# Patient Record
Sex: Female | Born: 1990 | Race: White | Hispanic: No | Marital: Single | Smoking: Never smoker
Health system: Southern US, Community
[De-identification: ages and names within clinical notes are randomized; demographics above are authoritative.]

## PROBLEM LIST (undated history)

## (undated) HISTORY — PX: FRACTURE SURGERY: SHX138

---
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
## 2013-05-30 ENCOUNTER — Encounter (HOSPITAL_COMMUNITY): Payer: Self-pay | Admitting: Emergency Medicine

## 2013-05-30 ENCOUNTER — Emergency Department (HOSPITAL_COMMUNITY)
Admission: EM | Admit: 2013-05-30 | Discharge: 2013-05-30 | Disposition: A | Discharge status: Home / Self Care | Payer: 59 | Attending: Emergency Medicine | Admitting: Emergency Medicine

## 2013-05-30 DIAGNOSIS — K0889 Other specified disorders of teeth and supporting structures: Secondary | ICD-10-CM

## 2013-05-30 DIAGNOSIS — K089 Disorder of teeth and supporting structures, unspecified: Secondary | ICD-10-CM | POA: Insufficient documentation

## 2013-05-30 MED ORDER — OXYCODONE-ACETAMINOPHEN 5-325 MG PO TABS: 1.0000 | ORAL_TABLET | ORAL | Status: DC | PRN

## 2013-05-30 MED ORDER — CLINDAMYCIN HCL 300 MG PO CAPS: 300.0000 mg | ORAL_CAPSULE | Freq: Four times a day (QID) | ORAL | Status: DC

## 2013-05-30 MED ORDER — ONDANSETRON 8 MG PO TBDP
8.0000 mg | ORAL_TABLET | Freq: Once | ORAL | Status: AC
  Administered 2013-05-30: 8 mg via ORAL
  Filled 2013-05-30: qty 1

## 2013-05-30 MED ORDER — CLINDAMYCIN HCL 150 MG PO CAPS
300.0000 mg | ORAL_CAPSULE | Freq: Once | ORAL | Status: AC
  Administered 2013-05-30: 300 mg via ORAL
  Filled 2013-05-30: qty 2

## 2013-05-30 MED ORDER — OXYCODONE-ACETAMINOPHEN 5-325 MG PO TABS
2.0000 | ORAL_TABLET | Freq: Once | ORAL | Status: AC
  Administered 2013-05-30: 2 via ORAL
  Filled 2013-05-30: qty 2

## 2013-05-30 MED ORDER — ONDANSETRON 8 MG PO TBDP: ORAL_TABLET | ORAL | Status: DC

## 2013-05-30 NOTE — ED Provider Notes (Signed)
CSN: [id number]     Arrival date & time 05/30/13  1442 History   First MD Initiated Contact with Patient 05/30/13 1538     Chief Complaint  Patient presents with  . Dental Pain     (Consider location/radiation/quality/duration/timing/severity/associated sxs/prior Treatment) HPI.... pain lower central and lateral incisors bilaterally for 24 hours. [facility] approximately 11 PM. Given one dose of amoxicillin and Tylenol No. 3. She was unable to see her dentist today because of the weather. Pain is getting worse. Now patient complains of tenderness in the chin.  no fever or chills. Severity is mild to moderate.  History reviewed. No pertinent past medical history. Past Surgical History  Procedure Laterality Date  . Fracture surgery     History reviewed. No pertinent family history. History  Substance Use Topics  . Smoking status: Never Smoker   . Smokeless tobacco: Not on file  . Alcohol Use: No   OB History   Grav Para Term Preterm Abortions TAB SAB Ect Mult Living                 Review of Systems  All other systems reviewed and are negative.      Allergies  Review of patient's allergies indicates no known allergies.  Home Medications   Current Outpatient Rx  Name  Route  Sig  Dispense  Refill  . acetaminophen (TYLENOL) 500 MG tablet   Oral   Take 500 mg by mouth daily as needed for mild pain.         . naproxen sodium (ALEVE) 220 MG tablet   Oral   Take 220 mg by mouth 2 (two) times daily with a meal.         . clindamycin (CLEOCIN) 300 MG capsule   Oral   Take 1 capsule (300 mg total) by mouth 4 (four) times daily. X 7 days   28 capsule   0   . ondansetron (ZOFRAN ODT) 8 MG disintegrating tablet      8mg  ODT q4 hours prn nausea   10 tablet   0   . oxyCODONE-acetaminophen (PERCOCET) 5-325 MG per tablet   Oral   Take 1 tablet by mouth every 4 (four) hours as needed.   25 tablet   0    BP 144/92  Pulse 103   Temp(Src) 98.9 F (37.2 C) (Oral)  Resp 20  Ht 5\' 6"  (1.676 m)  Wt 220 lb (99.791 kg)  BMI 35.53 kg/m2  SpO2 100%  LMP 05/09/2013 Physical Exam  Constitutional: She is oriented to person, place, and time. She appears well-developed and well-nourished.  HENT:  Head: Normocephalic and atraumatic.  Tender gingiva around the inferior central and lateral incisors bilaterally. Chin also tender. No erythema or pus pocket  Neck: Normal range of motion. Neck supple.  Musculoskeletal: Normal range of motion.  Neurological: She is alert and oriented to person, place, and time.  Skin: Skin is warm and dry.  Psychiatric: She has a normal mood and affect.    ED Course  Procedures (including critical care time) Labs Review Labs Reviewed - No data to display Imaging Review No results found.  EKG Interpretation   None       MDM   Final diagnoses:  Toothache    Will change medications to clindamycin, Percocet, Zofran 8 mg ODT until patient can see her dentist.  No meningeal signs    Donnetta HutchingBrian Oreste Majeed, MD 05/30/13 [phone]

## 2013-05-30 NOTE — Discharge Instructions (Signed)
Dental Pain Toothache is pain in or around a tooth. It may get worse with chewing or with cold or heat.  HOME CARE  Your dentist may use a numbing medicine during treatment. If so, you may need to avoid eating until the medicine wears off. Ask your dentist about this.  Only take medicine as told by your dentist or doctor.  Avoid chewing food near the painful tooth until after all treatment is done. Ask your dentist about this. GET HELP RIGHT AWAY IF:   The problem gets worse or new problems appear.  You have a fever.  There is redness and puffiness (swelling) of the face, jaw, or neck.  You cannot open your mouth.  There is pain in the jaw.  There is very bad pain that is not helped by medicine. MAKE SURE YOU:   Understand these instructions.  Will watch your condition.  Will get help right away if you are not doing well or get worse. Document Released: 09/07/2007 Document Revised: 06/13/2011 Document Reviewed: 09/07/2007 [facility] Patient Information 2014 [place].   New antibiotic, pain meds, nausea meds, can also take ibuprofen [otc] 3-4 tabs 3x/day

## 2013-05-30 NOTE — ED Notes (Signed)
Dental pain since yesterday, [facility]  Yesterday and given  Amoxicillin and tylenol with codeine.   No pain relief

## 2013-07-22 ENCOUNTER — Emergency Department (HOSPITAL_COMMUNITY): Payer: 59

## 2013-07-22 ENCOUNTER — Encounter (HOSPITAL_COMMUNITY): Payer: Self-pay | Admitting: Emergency Medicine

## 2013-07-22 ENCOUNTER — Emergency Department (HOSPITAL_COMMUNITY)
Admission: EM | Admit: 2013-07-22 | Discharge: 2013-07-23 | Disposition: A | Discharge status: Home / Self Care | Payer: 59 | Attending: Emergency Medicine | Admitting: Emergency Medicine

## 2013-07-22 DIAGNOSIS — Z3202 Encounter for pregnancy test, result negative: Secondary | ICD-10-CM | POA: Insufficient documentation

## 2013-07-22 DIAGNOSIS — Z79899 Other long term (current) drug therapy: Secondary | ICD-10-CM | POA: Insufficient documentation

## 2013-07-22 DIAGNOSIS — N12 Tubulo-interstitial nephritis, not specified as acute or chronic: Secondary | ICD-10-CM | POA: Insufficient documentation

## 2013-07-22 DIAGNOSIS — R638 Other symptoms and signs concerning food and fluid intake: Secondary | ICD-10-CM | POA: Insufficient documentation

## 2013-07-22 DIAGNOSIS — R109 Unspecified abdominal pain: Secondary | ICD-10-CM | POA: Insufficient documentation

## 2013-07-22 LAB — URINALYSIS, ROUTINE W REFLEX MICROSCOPIC
Bilirubin Urine: NEGATIVE
GLUCOSE, UA: NEGATIVE mg/dL
KETONES UR: NEGATIVE mg/dL
Nitrite: POSITIVE — AB
Specific Gravity, Urine: 1.015 (ref 1.005–1.030)
Urobilinogen, UA: 0.2 mg/dL (ref 0.0–1.0)
pH: 6 (ref 5.0–8.0)

## 2013-07-22 LAB — URINE MICROSCOPIC-ADD ON

## 2013-07-22 LAB — PREGNANCY, URINE: PREG TEST UR: NEGATIVE

## 2013-07-22 MED ORDER — ONDANSETRON HCL 4 MG/2ML IJ SOLN
4.0000 mg | Freq: Once | INTRAMUSCULAR | Status: AC
  Administered 2013-07-23: 4 mg via INTRAVENOUS
  Filled 2013-07-22: qty 2

## 2013-07-22 MED ORDER — MORPHINE SULFATE 4 MG/ML IJ SOLN
4.0000 mg | Freq: Once | INTRAMUSCULAR | Status: AC
  Administered 2013-07-23: 4 mg via INTRAVENOUS
  Filled 2013-07-22: qty 1

## 2013-07-22 MED ORDER — SODIUM CHLORIDE 0.9 % IV SOLN
Freq: Once | INTRAVENOUS | Status: AC
  Administered 2013-07-23: via INTRAVENOUS

## 2013-07-22 NOTE — ED Notes (Signed)
Dysuria, pain in back, vomited x 1 today. No nausea at present.

## 2013-07-23 LAB — CBC WITH DIFFERENTIAL/PLATELET
BASOS ABS: 0 10*3/uL (ref 0.0–0.1)
Basophils Relative: 0 % (ref 0–1)
Eosinophils Absolute: 0.1 10*3/uL (ref 0.0–0.7)
Eosinophils Relative: 1 % (ref 0–5)
HCT: 34 % — ABNORMAL LOW (ref 36.0–46.0)
HEMOGLOBIN: 10.8 g/dL — AB (ref 12.0–15.0)
Lymphocytes Relative: 19 % (ref 12–46)
Lymphs Abs: 1.5 10*3/uL (ref 0.7–4.0)
MCH: 24.8 pg — ABNORMAL LOW (ref 26.0–34.0)
MCHC: 31.8 g/dL (ref 30.0–36.0)
MCV: 78 fL (ref 78.0–100.0)
MONOS PCT: 9 % (ref 3–12)
Monocytes Absolute: 0.8 10*3/uL (ref 0.1–1.0)
Neutro Abs: 5.8 10*3/uL (ref 1.7–7.7)
Neutrophils Relative %: 71 % (ref 43–77)
Platelets: 216 10*3/uL (ref 150–400)
RBC: 4.36 MIL/uL (ref 3.87–5.11)
RDW: 15.2 % (ref 11.5–15.5)
WBC: 8.2 10*3/uL (ref 4.0–10.5)

## 2013-07-23 LAB — BASIC METABOLIC PANEL
BUN: 19 mg/dL (ref 6–23)
CO2: 28 mEq/L (ref 19–32)
Calcium: 9.3 mg/dL (ref 8.4–10.5)
Chloride: 101 mEq/L (ref 96–112)
Creatinine, Ser: 0.84 mg/dL (ref 0.50–1.10)
GFR calc Af Amer: >90 mL/min (ref >90)
GFR calc non Af Amer: >90 mL/min (ref >90)
Glucose, Bld: 103 mg/dL — ABNORMAL HIGH (ref 70–99)
POTASSIUM: 4.1 meq/L (ref 3.7–5.3)
Sodium: 139 mEq/L (ref 137–147)

## 2013-07-23 LAB — POC URINE PREG, ED: Preg Test, Ur: NEGATIVE

## 2013-07-23 MED ORDER — CEFTRIAXONE SODIUM 1 G IJ SOLR
1.0000 g | Freq: Once | INTRAMUSCULAR | Status: AC
  Administered 2013-07-23: 1 g via INTRAVENOUS
  Filled 2013-07-23: qty 10

## 2013-07-23 MED ORDER — OXYCODONE-ACETAMINOPHEN 5-325 MG PO TABS: 1.0000 | ORAL_TABLET | ORAL | Status: DC | PRN

## 2013-07-23 MED ORDER — CEPHALEXIN 500 MG PO CAPS: 500.0000 mg | ORAL_CAPSULE | Freq: Four times a day (QID) | ORAL | Status: DC

## 2013-07-23 MED ORDER — MORPHINE SULFATE 4 MG/ML IJ SOLN
2.0000 mg | Freq: Once | INTRAMUSCULAR | Status: AC
  Administered 2013-07-23: 2 mg via INTRAVENOUS
  Filled 2013-07-23: qty 1

## 2013-07-23 NOTE — ED Provider Notes (Signed)
CSN: [id number]     Arrival date & time 07/22/13  2057 History   First MD Initiated Contact with Patient 07/22/13 2331     Chief Complaint  Patient presents with  . Dysuria     (Consider location/radiation/quality/duration/timing/severity/associated sxs/prior Treatment) Patient is a 23 y.o. female presenting with dysuria. The history is provided by the patient.  Dysuria Pain quality:  Sharp Pain severity:  Moderate Onset quality:  Gradual Duration:  2 days Timing:  Constant Progression:  Worsening Chronicity:  New Recent urinary tract infections: no   Relieved by:  Nothing Worsened by:  Nothing tried Ineffective treatments:  Acetaminophen Urinary symptoms: frequent urination   Urinary symptoms: no discolored urine, no foul-smelling urine, no hematuria, no hesitancy and no bladder incontinence   Urinary symptoms comment:  Sharp, stabbing pain post void pain to the right flank Associated symptoms: flank pain, nausea and vomiting   Associated symptoms: no abdominal pain, no fever, no genital lesions and no vaginal discharge   Associated symptoms comment:  Patient denies fever, vaginal bleeding, pelvic pain or discharge.   Vomiting:    Quality:  Stomach contents   Number of occurrences:  1   Severity:  Mild   Duration:  6 hours   Vomiting timing: episodic.   Progression:  Unchanged Risk factors: no hx of pyelonephritis, not pregnant, no renal disease and no urinary catheter   Risk factors comment:  Hx of UTII's in the past, but not recently   History reviewed. No pertinent past medical history. Past Surgical History  Procedure Laterality Date  . Fracture surgery     History reviewed. No pertinent family history. History  Substance Use Topics  . Smoking status: Never Smoker   . Smokeless tobacco: Not on file  . Alcohol Use: No   OB History   Grav Para Term Preterm Abortions TAB SAB Ect Mult Living                 Review of Systems  Constitutional: Positive for  appetite change. Negative for fever and activity change.  Respiratory: Negative for chest tightness and shortness of breath.   Gastrointestinal: Positive for nausea and vomiting. Negative for abdominal pain.  Genitourinary: Positive for dysuria and flank pain. Negative for urgency, decreased urine volume, vaginal bleeding, vaginal discharge, difficulty urinating and pelvic pain.  Musculoskeletal: Positive for back pain.  Skin: Negative for rash.      Allergies  Review of patient's allergies indicates no known allergies.  Home Medications   Prior to Admission medications   Medication Sig Start Date End Date Taking? Authorizing Provider  acetaminophen (TYLENOL) 500 MG tablet Take 500 mg by mouth daily as needed for mild pain.    Historical Provider, MD  clindamycin (CLEOCIN) 300 MG capsule Take 1 capsule (300 mg total) by mouth 4 (four) times daily. X 7 days 05/30/13   Donnetta HutchingBrian Cook, MD  naproxen sodium (ALEVE) 220 MG tablet Take 220 mg by mouth 2 (two) times daily with a meal.    Historical Provider, MD  ondansetron (ZOFRAN ODT) 8 MG disintegrating tablet 8mg  ODT q4 hours prn nausea 05/30/13   Donnetta HutchingBrian Cook, MD  oxyCODONE-acetaminophen (PERCOCET) 5-325 MG per tablet Take 1 tablet by mouth every 4 (four) hours as needed. 05/30/13   Donnetta HutchingBrian Cook, MD   BP 155/94  Pulse 118  Temp(Src) 98.8 F (37.1 C) (Oral)  Resp 24  Ht 5\' 6"  (1.676 m)  Wt 200 lb (90.719 kg)  BMI 32.30 kg/m2  SpO2 100%  LMP 07/08/2013 Physical Exam  Nursing note and vitals reviewed. Constitutional: She is oriented to person, place, and time. She appears well-developed and well-nourished. No distress.  HENT:  Head: Normocephalic and atraumatic.  Mouth/Throat: Oropharynx is clear and moist.  Cardiovascular: Normal rate, regular rhythm, normal heart sounds and intact distal pulses.   No murmur heard. Pulmonary/Chest: Effort normal and breath sounds normal. No respiratory distress.  Abdominal: Soft. Normal appearance and bowel  sounds are normal. She exhibits no distension and no mass. There is no hepatosplenomegaly. There is tenderness. There is CVA tenderness. There is no rigidity, no rebound, no guarding and no tenderness at McBurney's point.  Right CVA tenderness is present, remaining abdominal exam is unremarkable  Musculoskeletal: Normal range of motion. She exhibits no edema.  Neurological: She is alert and oriented to person, place, and time. She exhibits normal muscle tone. Coordination normal.  Skin: Skin is warm and dry.    ED Course  Procedures (including critical care time) Labs Review Labs Reviewed  URINALYSIS, ROUTINE W REFLEX MICROSCOPIC - Abnormal; Notable for the following:    Hgb urine dipstick SMALL (*)    Protein, ur TRACE (*)    Nitrite POSITIVE (*)    Leukocytes, UA MODERATE (*)    All other components within normal limits  URINE MICROSCOPIC-ADD ON - Abnormal; Notable for the following:    Bacteria, UA FEW (*)    All other components within normal limits  CBC WITH DIFFERENTIAL - Abnormal; Notable for the following:    Hemoglobin 10.8 (*)    HCT 34.0 (*)    MCH 24.8 (*)    All other components within normal limits  BASIC METABOLIC PANEL - Abnormal; Notable for the following:    Glucose, Bld 103 (*)    All other components within normal limits  URINE CULTURE  PREGNANCY, URINE    Imaging Review Ct Abdomen Pelvis Wo Contrast  07/23/2013   CLINICAL DATA:  Right flank pain  EXAM: CT ABDOMEN AND PELVIS WITHOUT CONTRAST  TECHNIQUE: Multidetector CT imaging of the abdomen and pelvis was performed following the standard protocol without IV contrast.  COMPARISON:  None.  FINDINGS: No hydronephrosis.  No urinary calculus.  Gallbladder is decompressed.  Liver, pancreas, adrenal glands are within normal limits.  Spleen is mildly enlarged measuring 15 cm in length.  Normal appendix.  No free-fluid.  Bladder, uterus, and adnexa are within normal limits.  IMPRESSION: No evidence of urinary  obstruction or urinary calculus.  Mild splenomegaly.   Electronically Signed   By: Maryclare BeanArt  Hoss M.D.   On: 07/23/2013 01:38     EKG Interpretation None      Urine culture is pending  MDM   Final diagnoses:  Pyelonephritis    Patient with 2 day hx of right flank pain that is worse post void, dysuria, N/V, but w/o fever.  Sx's appear c/w pyelonephritis.  No concerning sx's for TOA or PID.  Will order labs, culture urine and CT abd/pelvis.    Pain , nausea addressed with morphine, zofran and rocephin.  Patient reports sx's improved, she is non-toxic appearing and appears stable for discharge.  Previous tachycardia likely related to level of pain.  She agrees to increase water intake, keflex, percocet and close f/u with her PMD.  I have advised her to return here for any worsening sx's such as increased pain, vomiting or fever.  She agrees to plan and verbalized understanding  Dorthy Magnussen L. Trisha Mangleriplett, PA-C 07/24/13 1333

## 2013-07-23 NOTE — Discharge Instructions (Signed)
Pyelonephritis, Adult °Pyelonephritis is a kidney infection. A kidney infection can happen quickly, or it can last for a long time. °HOME CARE  °· Take your medicine (antibiotics) as told. Finish it even if you start to feel better. °· Keep all doctor visits as told. °· Drink enough fluids to keep your pee (urine) clear or pale yellow. °· Only take medicine as told by your doctor. °GET HELP RIGHT AWAY IF:  °· You have a fever or lasting symptoms for more than 2-3 days. °· You have a fever and your symptoms suddenly get worse. °· You cannot take your medicine or drink fluids as told. °· You have chills and shaking. °· You feel very weak or pass out (faint). °· You do not feel better after 2 days. °MAKE SURE YOU: °· Understand these instructions. °· Will watch your condition. °· Will get help right away if you are not doing well or get worse. °Document Released: 04/28/2004 Document Revised: 09/20/2011 Document Reviewed: 09/08/2010 °ExitCare® Patient Information ©2014 [employer] LLC. ° °

## 2013-07-24 NOTE — ED Provider Notes (Signed)
Medical screening examination/treatment/procedure(s) were performed by non-physician practitioner and as supervising physician I was immediately available for consultation/collaboration.   Dione Boozeavid Nashali Ditmer, MD 07/24/13 [phone]

## 2013-07-25 LAB — URINE CULTURE

## 2013-07-26 ENCOUNTER — Telehealth (HOSPITAL_BASED_OUTPATIENT_CLINIC_OR_DEPARTMENT_OTHER): Payer: Self-pay | Admitting: Emergency Medicine

## 2013-07-26 NOTE — Telephone Encounter (Signed)
Post ED Visit - Positive Culture Follow-up  Culture report reviewed by antimicrobial stewardship pharmacist: []  Wes Dulaney, Pharm.D., BCPS []  Celedonio MiyamotoJeremy Frens, Pharm.D., BCPS []  Georgina PillionElizabeth Martin, [address] BCPS []  PunaluuMinh Pham, [address]Ph[address]., BCPS, AAHIVP []  Estella HuskMichelle Turner, Pharm.D., BCPS, AAHIVP [x]  Harvie JuniorNathan Cope, Pharm.D.  Positive urine culture Treated with Keflex, organism sensitive to the same and no further patient follow-up is required at this time.  Cheetara Hoge 07/26/2013, 10:48 AM

## 2013-07-29 MED FILL — Oxycodone w/ Acetaminophen Tab 5-325 MG: ORAL | Qty: 6 | Status: AC

## 2013-11-06 ENCOUNTER — Emergency Department (HOSPITAL_COMMUNITY)
Admission: EM | Admit: 2013-11-06 | Discharge: 2013-11-06 | Disposition: A | Discharge status: Home / Self Care | Payer: 59 | Attending: Emergency Medicine | Admitting: Emergency Medicine

## 2013-11-06 ENCOUNTER — Encounter (HOSPITAL_COMMUNITY): Payer: Self-pay | Admitting: Emergency Medicine

## 2013-11-06 ENCOUNTER — Emergency Department (HOSPITAL_COMMUNITY): Payer: 59

## 2013-11-06 DIAGNOSIS — M79609 Pain in unspecified limb: Secondary | ICD-10-CM | POA: Insufficient documentation

## 2013-11-06 DIAGNOSIS — Z8781 Personal history of (healed) traumatic fracture: Secondary | ICD-10-CM | POA: Diagnosis not present

## 2013-11-06 DIAGNOSIS — M7730 Calcaneal spur, unspecified foot: Secondary | ICD-10-CM | POA: Insufficient documentation

## 2013-11-06 DIAGNOSIS — Z79899 Other long term (current) drug therapy: Secondary | ICD-10-CM | POA: Diagnosis not present

## 2013-11-06 DIAGNOSIS — Z792 Long term (current) use of antibiotics: Secondary | ICD-10-CM | POA: Diagnosis not present

## 2013-11-06 DIAGNOSIS — Z791 Long term (current) use of non-steroidal anti-inflammatories (NSAID): Secondary | ICD-10-CM | POA: Insufficient documentation

## 2013-11-06 DIAGNOSIS — M7731 Calcaneal spur, right foot: Secondary | ICD-10-CM

## 2013-11-06 MED ORDER — NAPROXEN 375 MG PO TABS: 375.0000 mg | ORAL_TABLET | Freq: Two times a day (BID) | ORAL | Status: DC

## 2013-11-06 MED ORDER — HYDROCODONE-ACETAMINOPHEN 5-325 MG PO TABS: 1.0000 | ORAL_TABLET | ORAL | Status: DC | PRN

## 2013-11-06 NOTE — ED Notes (Signed)
Complain of pain in right heel. States it is worse in the morning

## 2013-11-06 NOTE — ED Provider Notes (Signed)
CSN: [id number]     Arrival date & time 11/06/13  1408 History   None    Chief Complaint  Patient presents with  . Foot Pain     (Consider location/radiation/quality/duration/timing/severity/associated sxs/prior Treatment) Patient is a 23 y.o. female presenting with lower extremity pain. The history is provided by the patient.  Foot Pain This is a new problem. The current episode started in the past 7 days. The problem occurs constantly. The problem has been gradually worsening. The symptoms are aggravated by standing and walking. She has tried acetaminophen for the symptoms.   Mary Gay is a 23 y.o. female who presents to the ED with right heel pain. 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  History reviewed. No pertinent past medical history. Past Surgical History  Procedure Laterality Date  . Fracture surgery     No family history on file. History  Substance Use Topics  . Smoking status: Never Smoker   . Smokeless tobacco: Not on file  . Alcohol Use: No   OB History   Grav Para Term Preterm Abortions TAB SAB Ect Mult Living                 Review of Systems Negative except as stated in HPI   Allergies  Review of patient's allergies indicates no known allergies.  Home Medications   Prior to Admission medications   Medication Sig Start Date End Date Taking? Authorizing Provider  acetaminophen (TYLENOL) 500 MG tablet Take 500 mg by mouth daily as needed for mild pain.    Historical Provider, MD  cephALEXin (KEFLEX) 500 MG capsule Take 1 capsule (500 mg total) by mouth 4 (four) times daily. For 10 days 07/23/13   Tammy L. Triplett, PA-C  clindamycin (CLEOCIN) 300 MG capsule Take 1 capsule (300 mg total) by mouth 4 (four) times daily. X 7 days 05/30/13   Donnetta HutchingBrian Cook, MD  naproxen sodium (ALEVE) 220 MG tablet Take 220 mg by mouth 2 (two) times daily with a meal.    Historical Provider, MD  ondansetron (ZOFRAN ODT) 8 MG  disintegrating tablet 8mg  ODT q4 hours prn nausea 05/30/13   Donnetta HutchingBrian Cook, MD  oxyCODONE-acetaminophen (PERCOCET) 5-325 MG per tablet Take 1 tablet by mouth every 4 (four) hours as needed. 05/30/13   Donnetta HutchingBrian Cook, MD  oxyCODONE-acetaminophen (PERCOCET/ROXICET) 5-325 MG per tablet Take 1 tablet by mouth every 4 (four) hours as needed for severe pain. 07/23/13   Tammy L. Triplett, PA-C  oxyCODONE-acetaminophen (PERCOCET/ROXICET) 5-325 MG per tablet Take 1 tablet by mouth every 4 (four) hours as needed for severe pain. 07/23/13   Tammy L. Triplett, PA-C   BP 152/84  Pulse 94  Temp(Src) 98.2 F (36.8 C) (Oral)  Resp 18  Ht 5\' 6"  (1.676 m)  Wt 210 lb (95.255 kg)  BMI 33.91 kg/m2  SpO2 100%  LMP 10/11/2013 Physical Exam  Nursing note and vitals reviewed. Constitutional: She is oriented to person, place, and time. She appears well-developed and well-nourished. No distress.  HENT:  Head: Normocephalic.  Eyes: EOM are normal.  Neck: Neck supple.  Pulmonary/Chest: Effort normal.  Abdominal: Soft. There is no tenderness.  Musculoskeletal:       Right foot: She exhibits tenderness. She exhibits normal range of motion, no swelling, normal capillary refill, no deformity and no laceration.       Feet:  Tenderness with palpation of the right heel. Pedal pulse strong, adequate circulation, good  touch sensation.   Neurological: She is alert and oriented to person, place, and time. No cranial nerve deficit.  Skin: Skin is warm and dry.  Psychiatric: She has a normal mood and affect. Her behavior is normal.    ED Course  Procedures (including critical care time) Labs Review Labs Reviewed - No data to display  Imaging Review Dg Foot Complete Right  11/06/2013   CLINICAL DATA:  right heel pain right heel pain  EXAM: RIGHT FOOT COMPLETE - 3+ VIEW  COMPARISON:  Calcaneal spurs.  FINDINGS: There is no evidence of fracture or dislocation. There is no evidence of arthropathy or other focal bone abnormality.  Soft tissues are unremarkable. Small calcaneal spurs at the Achilles tendon and plantar aponeurosis insertions.  IMPRESSION: 1. Negative for fracture or other acute abnormality. 2. Calcaneal spurs   Electronically Signed   By: Oley Balm M.D.   On: 11/06/2013 15:10     MDM  23 y.o. female with right heel pain that has gotten worse over the past few days with walking on concrete floor. Discussed pain management and medication for inflammation. Discussed heel cup and follow up with ortho. Discussed with the patient and all questioned fully answered. She  Return if any problems arise.    Medication List    STOP taking these medications       oxyCODONE-acetaminophen 5-325 MG per tablet  Commonly known as:  PERCOCET/ROXICET      TAKE these medications       HYDROcodone-acetaminophen 5-325 MG per tablet  Commonly known as:  NORCO/VICODIN  Take 1 tablet by mouth every 4 (four) hours as needed.     naproxen 375 MG tablet  Commonly known as:  NAPROSYN  Take 1 tablet (375 mg total) by mouth 2 (two) times daily.      ASK your doctor about these medications       acetaminophen 500 MG tablet  Commonly known as:  TYLENOL  Take 500 mg by mouth daily as needed for mild pain.     ALEVE 220 MG tablet  Generic drug:  naproxen sodium  Take 220 mg by mouth 2 (two) times daily with a meal.     cephALEXin 500 MG capsule  Commonly known as:  KEFLEX  Take 1 capsule (500 mg total) by mouth 4 (four) times daily. For 10 days     clindamycin 300 MG capsule  Commonly known as:  CLEOCIN  Take 1 capsule (300 mg total) by mouth 4 (four) times daily. X 7 days     ondansetron 8 MG disintegrating tablet  Commonly known as:  ZOFRAN ODT  8mg  ODT q4 hours prn nausea           Janne Napoleon, NP 11/07/13 1644

## 2013-11-06 NOTE — ED Notes (Signed)
Pt with right heel pain for 1 and 1/2 week with putting weight on it esp first thing in morning

## 2013-11-06 NOTE — Discharge Instructions (Signed)
Heel Spur A heel spur is a hook of bone that can form on the calcaneus (the heel bone and the largest bone of the foot). Heel spurs are often associated with plantar fasciitis and usually come in people who have had the problem for an extended period of time. The cause of the relationship is unknown. The pain associated with them is thought to be caused by an inflammation (soreness and redness) of the plantar fascia rather than the spur itself. The plantar fascia is a thick fibrous like tissue that runs from the calcaneus (heel bone) to the ball of the foot. This strong, tight tissue helps maintain the arch of your foot. It helps distribute the weight across your foot as you walk or run. Stresses placed on the plantar fascia can be tremendous. When it is inflamed normal activities become painful. Pain is worse in the morning after sleeping. After sleeping the plantar fascia is tight. The first movements stretch the fascia and this causes pain. As the tendon loosens, the pain usually gets better. It often returns with too much standing or walking.  About 70% of patients with plantar fasciitis have a heel spur. About half of people without foot pain also have heel spurs. DIAGNOSIS  The diagnosis of a heel spur is made by X-ray. The X-ray shows a hook of bone protruding from the bottom of the calcaneus at the point where the plantar fascia is attached to the heel bone.  TREATMENT  It is necessary to find out what is causing the stretching of the plantar fascia. If the cause is over-pronation (flat feet), orthotics and proper foot ware may help.  Stretching exercises, losing weight, wearing shoes that have a cushioned heel that absorbs shock, and elevating the heel with the use of a heel cradle, heel cup, or orthotics may all help. Heel cradles and heel cups provide extra comfort and cushion to the heel, and reduce the amount of shock to the sore area. AVOIDING THE PAIN OF PLANTAR FASCIITIS AND HEEL  SPURS  Consult a sports medicine professional before beginning a new exercise program.  Walking programs offer a good workout. There is a lower chance of overuse injuries common to the runners. There is less impact and less jarring of the joints.  Begin all new exercise programs slowly. If problems or pains develop, decrease the amount of time or distance until you are at a comfortable level.  Wear good shoes and replace them regularly.  Stretch your foot and the heel cords at the back of the ankle (Achilles tendons) both before and after exercise.  Run or exercise on even surfaces that are not hard. For example, asphalt is better than pavement.  Do not run barefoot on hard surfaces.  If using a treadmill, vary the incline.  Do not continue to workout if you have foot or joint problems. Seek professional help if they do not improve. HOME CARE INSTRUCTIONS   Avoid activities that cause you pain until you recover.  Use ice or cold packs to the problem or painful areas after working out.  Only take over-the-counter or prescription medicines for pain, discomfort, or fever as directed by your caregiver.  Soft shoe inserts or athletic shoes with air or gel sole cushions may be helpful.  If problems continue or become more severe, consult a sports medicine caregiver. Cortisone is a potent anti-inflammatory medication that may be injected into the painful area. You can discuss this treatment with your caregiver. MAKE SURE YOU:     Understand these instructions.  Will watch your condition.  Will get help right away if you are not doing well or get worse. Document Released: 04/27/2005 Document Revised: 06/13/2011 Document Reviewed: 05/22/2013 Exit[employer] Patient Information 2015 [employer]. This information is not intended to replace advice given to you by your health care provider. Make sure you discuss any questions you have with your health care provider.  

## 2013-11-07 NOTE — ED Provider Notes (Signed)
Medical screening examination/treatment/procedure(s) were performed by non-physician practitioner and as supervising physician I was immediately available for consultation/collaboration.   EKG Interpretation None        Cadince Hilscher, MD 11/07/13 1905 

## 2013-11-19 ENCOUNTER — Encounter: Payer: Self-pay | Admitting: Orthopedic Surgery

## 2013-11-19 ENCOUNTER — Ambulatory Visit (INDEPENDENT_AMBULATORY_CARE_PROVIDER_SITE_OTHER): Payer: 59 | Admitting: Orthopedic Surgery

## 2013-11-19 VITALS — BP 140/89 | Ht 66.0 in | Wt 210.0 lb

## 2013-11-19 DIAGNOSIS — M722 Plantar fascial fibromatosis: Secondary | ICD-10-CM | POA: Insufficient documentation

## 2013-11-19 NOTE — Progress Notes (Signed)
New patient  Chief complaint pain right foot in the heel  23 year old female presents with a two-month history of pain in the plantar aspect of right foot which is moderate to severe unrelieved by naproxen. She did go to emergency room for evaluation. She was sent here for further followup and treatment  She complains of restart pain after sitting and when she gets out of bed in the morning. No radiation of the pain is a sharp throbbing burning aching sensation. She did wear some inserts without relief.  Her review of systems is negative x14 systems.  BP 140/89  Ht 5\' 6"  (1.676 m)  Wt 210 lb (95.255 kg)  BMI 33.91 kg/m2  LMP 10/11/2013 She is a little bit overweight but otherwise grooming and hygiene are normal she is oriented x3 her mood is great gait is normal. Her foot does not show excessive flatness or excessive high arch. She has normal ankle range of motion the ankle joint is stable she has tenderness in the plantar fascia muscle tone is normal skin is intact there are no rashes or lesions she has good pulse and normal sensation the opposite heel is nontender in the opposite ankle has normal range of motion  The patient has plantar fasciitis we will treat her with an injection in home exercises and patient education followup as needed continue with ibuprofen 800 mg 3 times a day   Procedure note  Injection for plantar fasciitis  Right heel injection  Verbal consent was given timeout was taken to confirm right heel as injection site  Ethyl chloride and alcohol used to prepare the skin. Depo-Medrol 40 mg and lidocaine 1% 2 cc were used to inject the heel area at the point of maximal tenderness  There were no complications

## 2013-11-19 NOTE — Patient Instructions (Signed)
Home exercises  

## 2013-11-20 ENCOUNTER — Telehealth: Payer: Self-pay | Admitting: Orthopedic Surgery

## 2013-11-20 NOTE — Telephone Encounter (Signed)
Routing to Dr Harrison 

## 2013-11-20 NOTE — Telephone Encounter (Signed)
You have received a steroid shot. 15% of patients experience increased pain at the injection site with in the next 24 hours. This is best treated with ice and tylenol extra strength 2 tabs every 8 hours. If you are still having pain please call the office.    

## 2013-11-20 NOTE — Telephone Encounter (Signed)
Patient aware.

## 2013-11-20 NOTE — Telephone Encounter (Signed)
Patient called to inquire about her heel still hurting following injection 11/19/13;  States she has been icing, and taken ibuprofen, but was concerned, and asking if normal to still be uncomfortable. Her ph# is [phone]

## 2015-04-02 IMAGING — CT CT ABD-PELV W/O CM
2 of 3 series · 9 of 46 positions shown, 11 images · non-contrast
Comparison: None.

CLINICAL DATA: Right flank pain

EXAM:
CT ABDOMEN AND PELVIS WITHOUT CONTRAST
TECHNIQUE: Multidetector CT imaging of the abdomen and pelvis was performed
following the standard protocol without IV contrast.

[Series 4: mpr coronal (id) · coronal · 0.91mm/px · 8 of 102 slices shown, 9 images]
[im 12/102  soft-tissue]
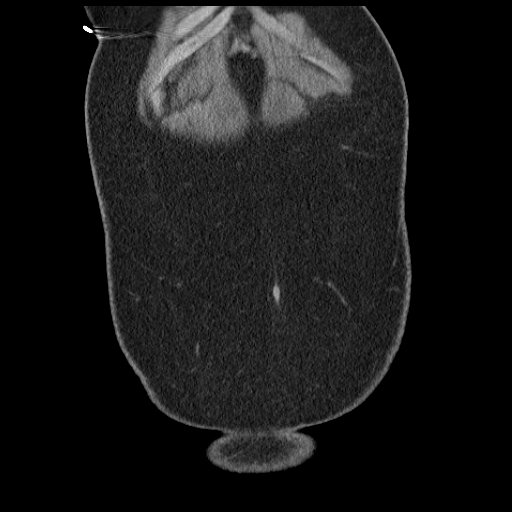
[im 12/102  bone]
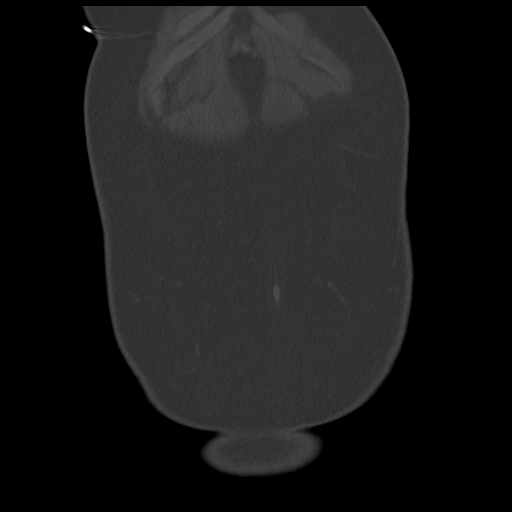
[im 23/102  soft-tissue]
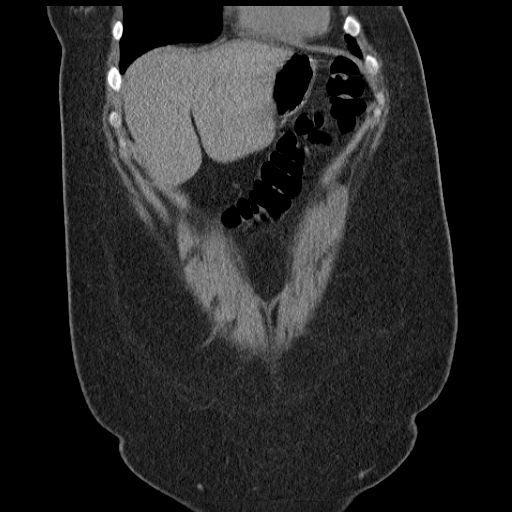
[im 34/102  soft-tissue]
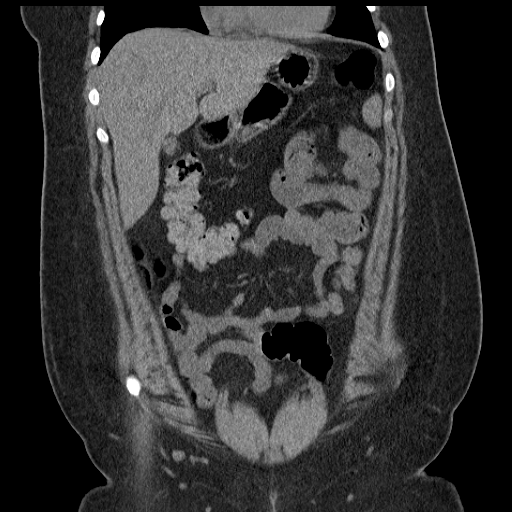
[im 45/102  soft-tissue]
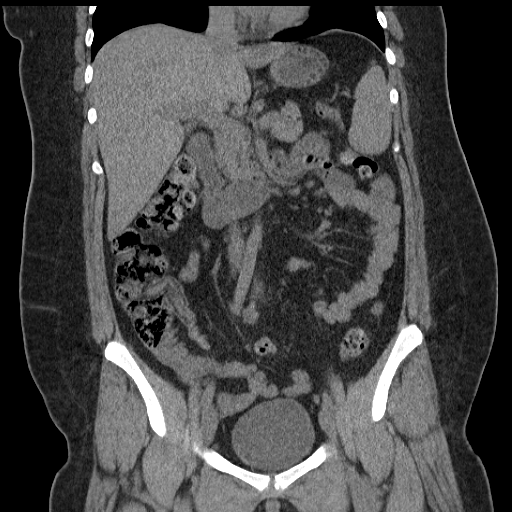
[im 57/102  soft-tissue]
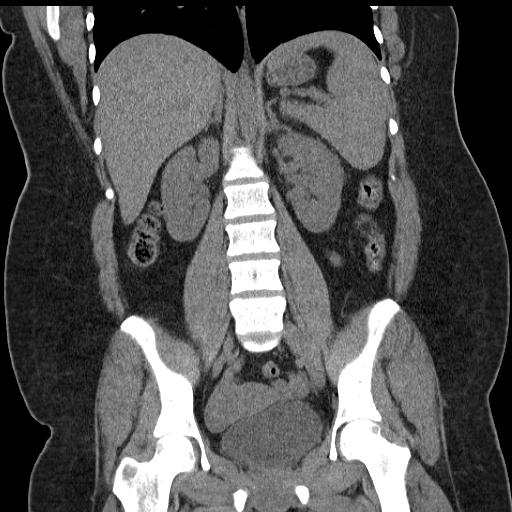
[im 68/102  soft-tissue]
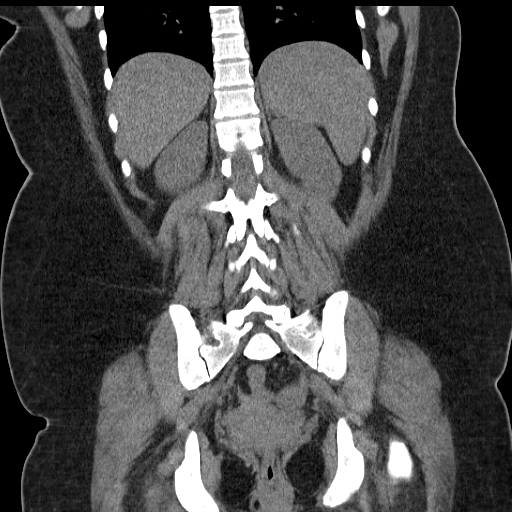
[im 79/102  soft-tissue]
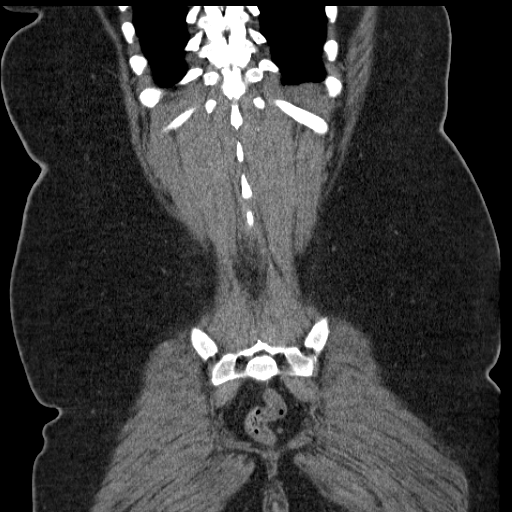
[im 90/102  soft-tissue]
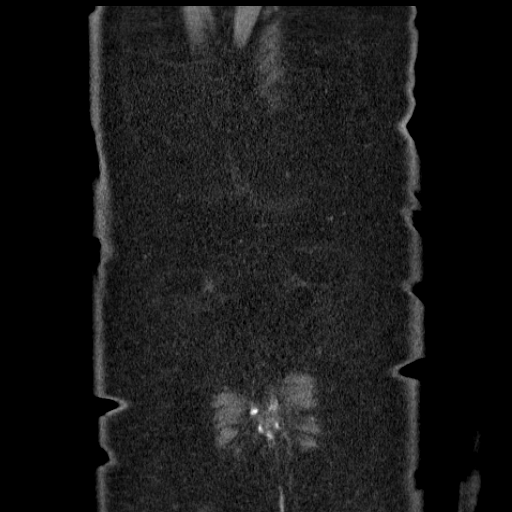

[Series 5: mpr sagittal (id) · sagittal · 0.62mm/px · 1 of 143 slices shown, 2 images]
[im 48/143  soft-tissue]
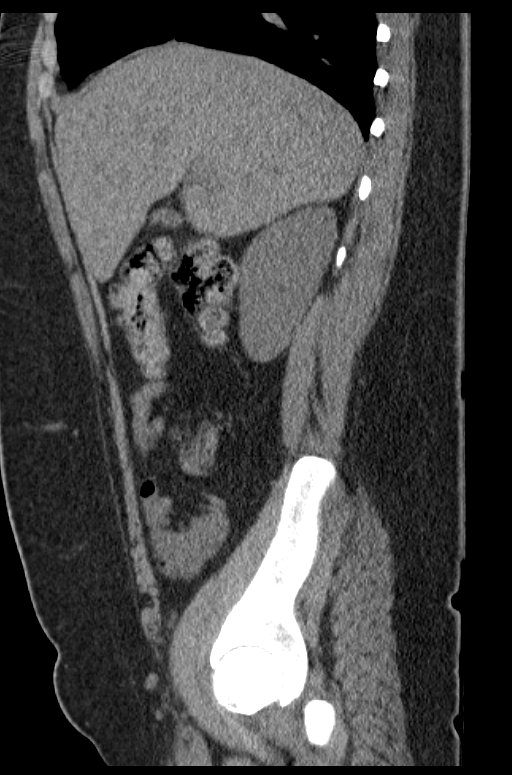
[im 48/143  bone]
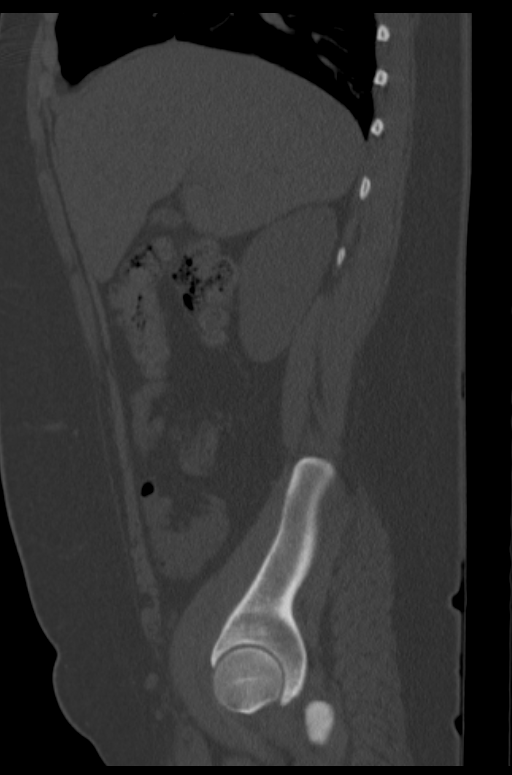

[9 of 46 positions shown; findings below may reference images not displayed]

FINDINGS: No hydronephrosis.  No urinary calculus.

Gallbladder is decompressed.

Liver, pancreas, adrenal glands are within normal limits.

Spleen is mildly enlarged measuring 15 cm in length.

Normal appendix.

No free-fluid.

Bladder, uterus, and adnexa are within normal limits.
IMPRESSION: No evidence of urinary obstruction or urinary calculus.

Mild splenomegaly.

## 2015-07-05 ENCOUNTER — Encounter (HOSPITAL_COMMUNITY): Payer: Self-pay | Admitting: Emergency Medicine

## 2015-07-05 ENCOUNTER — Emergency Department (HOSPITAL_COMMUNITY): Payer: 59

## 2015-07-05 ENCOUNTER — Emergency Department (HOSPITAL_COMMUNITY)
Admission: EM | Admit: 2015-07-05 | Discharge: 2015-07-05 | Disposition: A | Discharge status: Home / Self Care | Payer: 59 | Attending: Emergency Medicine | Admitting: Emergency Medicine

## 2015-07-05 DIAGNOSIS — X58XXXA Exposure to other specified factors, initial encounter: Secondary | ICD-10-CM | POA: Insufficient documentation

## 2015-07-05 DIAGNOSIS — Y990 Civilian activity done for income or pay: Secondary | ICD-10-CM | POA: Diagnosis not present

## 2015-07-05 DIAGNOSIS — S99911A Unspecified injury of right ankle, initial encounter: Secondary | ICD-10-CM | POA: Diagnosis present

## 2015-07-05 DIAGNOSIS — Y939 Activity, unspecified: Secondary | ICD-10-CM | POA: Insufficient documentation

## 2015-07-05 DIAGNOSIS — Z79899 Other long term (current) drug therapy: Secondary | ICD-10-CM | POA: Insufficient documentation

## 2015-07-05 DIAGNOSIS — S9031XA Contusion of right foot, initial encounter: Secondary | ICD-10-CM

## 2015-07-05 DIAGNOSIS — Y9269 Other specified industrial and construction area as the place of occurrence of the external cause: Secondary | ICD-10-CM | POA: Diagnosis not present

## 2015-07-05 DIAGNOSIS — S93401A Sprain of unspecified ligament of right ankle, initial encounter: Secondary | ICD-10-CM | POA: Diagnosis not present

## 2015-07-05 MED ORDER — NAPROXEN 500 MG PO TABS: 500.0000 mg | ORAL_TABLET | Freq: Two times a day (BID) | ORAL | Status: DC

## 2015-07-05 MED ORDER — ACETAMINOPHEN 500 MG PO TABS
1000.0000 mg | ORAL_TABLET | Freq: Once | ORAL | Status: AC
  Administered 2015-07-05: 1000 mg via ORAL
  Filled 2015-07-05: qty 2

## 2015-07-05 NOTE — ED Notes (Signed)
Crutch teaching performed

## 2015-07-05 NOTE — ED Provider Notes (Signed)
History  By signing my name below, I, Karle Plumber, attest that this documentation has been prepared under the direction and in the presence of Burgess Amor, PA-C. Electronically Signed: Karle Plumber, ED Scribe. 07/05/2015. 6:11 PM.  Chief Complaint  Patient presents with  . Ankle Pain   The history is provided by the patient and medical records. No language interpreter was used.    HPI Comments:  Mary Gay is a 25 y.o. obese female who presents to the Emergency Department complaining of a shooting pain to the arch of her right foot that began about three hours ago. Pt reports associated constant right heel pain. 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 She has had Naproxen 500 mg earlier this morning (daily medication) but nothing since the incident. She has applied an ace wrap and iced the area. Touching the area, moving her toes or bearing weight increases her pain. She denies alleviating factors. She denies numbness, tingling or weakness of the right ankle or foot, bruising, wounds, nausea or vomiting. PCP is Dr. Maryellen Pile in [facility]   History reviewed. No pertinent past medical history. Past Surgical History  Procedure Laterality Date  . Fracture surgery     History reviewed. No pertinent family history. Social History  Substance Use Topics  . Smoking status: Never Smoker   . Smokeless tobacco: None  . Alcohol Use: No   OB History    No data available     Review of Systems  Constitutional: Negative for fever.  Gastrointestinal: Negative for nausea and vomiting.  Musculoskeletal: Positive for joint swelling and arthralgias. Negative for myalgias.  Skin: Negative for color change and wound.  Neurological: Negative for weakness and numbness.    Allergies  Review of patient's allergies indicates no known allergies.  Home Medications   Prior to Admission medications   Medication Sig Start Date End Date Taking? Authorizing Provider   citalopram (CELEXA) 20 MG tablet Take 20 mg by mouth daily.   Yes Historical Provider, MD  naproxen (NAPROSYN) 500 MG tablet Take 1 tablet (500 mg total) by mouth 2 (two) times daily. 07/05/15   Burgess Amor, PA-C   Triage Vitals: BP 144/79 mmHg  Pulse 118  Temp(Src) 98.6 F (37 C) (Temporal)  Resp 16  Ht  (1.676 m)  Wt 245 lb (111.131 kg)  BMI 39.56 kg/m2  SpO2 100%  LMP 06/17/2015 Physical Exam  Constitutional: She appears well-developed and well-nourished.  HENT:  Head: Atraumatic.  Neck: Normal range of motion.  Cardiovascular:  Pulses equal bilaterally. DP pulses intact.  Musculoskeletal: She exhibits tenderness.       Right ankle: She exhibits no ecchymosis, no deformity and normal pulse. Tenderness. Lateral malleolus tenderness found.  Achilles tendon without palpable defect but pt endorses mild tenderness at insertion of achilles tendon. Tenderness to palpation to plantar calcaneus proximally without edema or palpable deformity. Nontender at proximal fibula.  Neurological: She is alert. She has normal strength. She displays normal reflexes. No sensory deficit.  Distal sensations intact.  Skin: Skin is warm and dry.  Psychiatric: She has a normal mood and affect.  Nursing note and vitals reviewed.   ED Course  Procedures (including critical care time) DIAGNOSTIC STUDIES: Oxygen Saturation is 100% on RA, normal by my interpretation.   COORDINATION OF CARE: 5:26 PM- Will X-Ray right ankle and right foot. Will order Tylenol for pain prior to imaging. Pt verbalizes understanding and agrees to plan.  Medications  acetaminophen (TYLENOL) tablet 1,000 mg (1,000 mg Oral Given 07/05/15 1730)    Labs Review Labs Reviewed - No data to display  Imaging Review Dg Ankle Complete Right  07/05/2015  CLINICAL DATA:  Injured foot and ankle today. EXAM: RIGHT ANKLE - COMPLETE 3+ VIEW; RIGHT FOOT COMPLETE - 3+ VIEW COMPARISON:  Foot radiographs 11/06/2013 FINDINGS: Right ankle: The  ankle mortise is maintained. No acute ankle fracture. No osteochondral lesion. No joint effusion. The mid and hindfoot bony structures are intact. A calcaneal heel spur is noted. Right foot: The joint spaces are maintained.  No acute fracture is identified. IMPRESSION: No acute bony findings. Electronically Signed   By: Rudie MeyerP.  Gallerani M.D.   On: 07/05/2015 18:04   Dg Foot Complete Right  07/05/2015  CLINICAL DATA:  Injured foot and ankle today. EXAM: RIGHT ANKLE - COMPLETE 3+ VIEW; RIGHT FOOT COMPLETE - 3+ VIEW COMPARISON:  Foot radiographs 11/06/2013 FINDINGS: Right ankle: The ankle mortise is maintained. No acute ankle fracture. No osteochondral lesion. No joint effusion. The mid and hindfoot bony structures are intact. A calcaneal heel spur is noted. Right foot: The joint spaces are maintained.  No acute fracture is identified. IMPRESSION: No acute bony findings. Electronically Signed   By: Rudie MeyerP.  Gallerani M.D.   On: 07/05/2015 18:04   I have personally reviewed and evaluated these images and lab results as part of my medical decision-making.   EKG Interpretation None      MDM   Final diagnoses:  Contusion of heel, right, initial encounter  Ankle sprain, right, initial encounter    RICE, aso, crutches. Naproxen. F/u with ortho prn if sx not improving over the week.  PRN f/u otherwise anticipated.  I personally performed the services described in this documentation, which was scribed in my presence. The recorded information has been reviewed and is accurate.    Burgess AmorJulie Wilbon Obenchain, PA-C 07/07/15 1528  Eber HongBrian Miller, MD 07/07/15 [phone]

## 2015-07-05 NOTE — ED Notes (Signed)
Pt reports heel pain after "rolling" her ankle at work. Pt ambulates without assistance

## 2015-07-05 NOTE — ED Notes (Signed)
Pt reports rolling right ankle at work, having heel and plantar pain.

## 2015-07-05 NOTE — Discharge Instructions (Signed)
Ankle Sprain °An ankle sprain is an injury to the strong, fibrous tissues (ligaments) that hold the bones of your ankle joint together.  °CAUSES °An ankle sprain is usually caused by a fall or by twisting your ankle. Ankle sprains most commonly occur when you step on the outer edge of your foot, and your ankle turns inward. People who participate in sports are more prone to these types of injuries.  °SYMPTOMS  °· Pain in your ankle. The pain may be present at rest or only when you are trying to stand or walk. °· Swelling. °· Bruising. Bruising may develop immediately or within 1 to 2 days after your injury. °· Difficulty standing or walking, particularly when turning corners or changing directions. °DIAGNOSIS  °Your caregiver will ask you details about your injury and perform a physical exam of your ankle to determine if you have an ankle sprain. During the physical exam, your caregiver will press on and apply pressure to specific areas of your foot and ankle. Your caregiver will try to move your ankle in certain ways. An X-ray exam may be done to be sure a bone was not broken or a ligament did not separate from one of the bones in your ankle (avulsion fracture).  °TREATMENT  °Certain types of braces can help stabilize your ankle. Your caregiver can make a recommendation for this. Your caregiver may recommend the use of medicine for pain. If your sprain is severe, your caregiver may refer you to a surgeon who helps to restore function to parts of your skeletal system (orthopedist) or a physical therapist. °HOME CARE INSTRUCTIONS  °· Apply ice to your injury for 1-2 days or as directed by your caregiver. Applying ice helps to reduce inflammation and pain. °¨ Put ice in a plastic bag. °¨ Place a towel between your skin and the bag. °¨ Leave the ice on for 15-20 minutes at a time, every 2 hours while you are awake. °· Only take over-the-counter or prescription medicines for pain, discomfort, or fever as directed by  your caregiver. °· Elevate your injured ankle above the level of your heart as much as possible for 2-3 days. °· If your caregiver recommends crutches, use them as instructed. Gradually put weight on the affected ankle. Continue to use crutches or a cane until you can walk without feeling pain in your ankle. °· If you have a plaster splint, wear the splint as directed by your caregiver. Do not rest it on anything harder than a pillow for the first 24 hours. Do not put weight on it. Do not get it wet. You may take it off to take a shower or bath. °· You may have been given an elastic bandage to wear around your ankle to provide support. If the elastic bandage is too tight (you have numbness or tingling in your foot or your foot becomes cold and blue), adjust the bandage to make it comfortable. °· If you have an air splint, you may blow more air into it or let air out to make it more comfortable. You may take your splint off at night and before taking a shower or bath. Wiggle your toes in the splint several times per day to decrease swelling. °SEEK MEDICAL CARE IF:  °· You have rapidly increasing bruising or swelling. °· Your toes feel extremely cold or you lose feeling in your foot. °· Your pain is not relieved with medicine. °SEEK IMMEDIATE MEDICAL CARE IF: °· Your toes are numb or blue. °·   You have severe pain that is increasing. MAKE SURE YOU:   Understand these instructions.  Will watch your condition.  Will get help right away if you are not doing well or get worse.   This information is not intended to replace advice given to you by your health care provider. Make sure you discuss any questions you have with your health care provider.   Document Released: 03/21/2005 Document Revised: 04/11/2014 Document Reviewed: 04/02/2011 Elsevier Interactive Patient Education 2016 Else[employer] Inc[employer]  Contusion A contusion is a deep bruise. Contusions are the result of a blunt injury to tissues and muscle fibers  under the skin. The injury causes bleeding under the skin. The skin overlying the contusion may turn blue, purple, or yellow. Minor injuries will give you a painless contusion, but more severe contusions may stay painful and swollen for a few weeks.  CAUSES  This condition is usually caused by a blow, trauma, or direct force to an area of the body. SYMPTOMS  Symptoms of this condition include:  Swelling of the injured area.  Pain and tenderness in the injured area.  Discoloration. The area may have redness and then turn blue, purple, or yellow. DIAGNOSIS  This condition is diagnosed based on a physical exam and medical history. An X-ray, CT scan, or MRI may be needed to determine if there are any associated injuries, such as broken bones (fractures). TREATMENT  Specific treatment for this condition depends on what area of the body was injured. In general, the best treatment for a contusion is resting, icing, applying pressure to (compression), and elevating the injured area. This is often called the RICE strategy. Over-the-counter anti-inflammatory medicines may also be recommended for pain control.  HOME CARE INSTRUCTIONS   Rest the injured area.  If directed, apply ice to the injured area:  Put ice in a plastic bag.  Place a towel between your skin and the bag.  Leave the ice on for 20 minutes, 2-3 times per day.  If directed, apply light compression to the injured area using an elastic bandage. Make sure the bandage is not wrapped too tightly. Remove and reapply the bandage as directed by your health care provider.  If possible, raise (elevate) the injured area above the level of your heart while you are sitting or lying down.  Take over-the-counter and prescription medicines only as told by your health care provider. SEEK MEDICAL CARE IF:  Your symptoms do not improve after several days of treatment.  Your symptoms get worse.  You have difficulty moving the injured  area. SEEK IMMEDIATE MEDICAL CARE IF:   You have severe pain.  You have numbness in a hand or foot.  Your hand or foot turns pale or cold.   This information is not intended to replace advice given to you by your health care provider. Make sure you discuss any questions you have with your health care provider.   Document Released: 12/29/2004 Document Revised: 12/10/2014 Document Reviewed: 08/06/2014 Elsevier Interactive Patient Education 2016 Elsevier Inc.   Wear the ASO and use crutches to avoid weight bearing.  Use ice and elevation as much as possible for the next several days to help reduce the swelling.  Use the naproxen for pain and inflammation.  Call the orthopedic doctor listed for a recheck of your injury in 1 week if your symptoms are not improving.

## 2015-10-01 ENCOUNTER — Emergency Department (HOSPITAL_COMMUNITY)
Admission: EM | Admit: 2015-10-01 | Discharge: 2015-10-01 | Disposition: A | Discharge status: Home / Self Care | Payer: 59 | Attending: Emergency Medicine | Admitting: Emergency Medicine

## 2015-10-01 ENCOUNTER — Encounter (HOSPITAL_COMMUNITY): Payer: Self-pay | Admitting: Emergency Medicine

## 2015-10-01 ENCOUNTER — Emergency Department (HOSPITAL_COMMUNITY): Payer: 59

## 2015-10-01 DIAGNOSIS — Y999 Unspecified external cause status: Secondary | ICD-10-CM | POA: Insufficient documentation

## 2015-10-01 DIAGNOSIS — X501XXA Overexertion from prolonged static or awkward postures, initial encounter: Secondary | ICD-10-CM | POA: Insufficient documentation

## 2015-10-01 DIAGNOSIS — Y939 Activity, unspecified: Secondary | ICD-10-CM | POA: Diagnosis not present

## 2015-10-01 DIAGNOSIS — Y929 Unspecified place or not applicable: Secondary | ICD-10-CM | POA: Diagnosis not present

## 2015-10-01 DIAGNOSIS — S8391XA Sprain of unspecified site of right knee, initial encounter: Secondary | ICD-10-CM

## 2015-10-01 DIAGNOSIS — S8991XA Unspecified injury of right lower leg, initial encounter: Secondary | ICD-10-CM | POA: Diagnosis present

## 2015-10-01 MED ORDER — IBUPROFEN 800 MG PO TABS
800.0000 mg | ORAL_TABLET | Freq: Once | ORAL | Status: AC
  Administered 2015-10-01: 800 mg via ORAL
  Filled 2015-10-01: qty 1

## 2015-10-01 MED ORDER — NAPROXEN 500 MG PO TABS: ORAL_TABLET | ORAL | Status: AC

## 2015-10-01 NOTE — Discharge Instructions (Signed)
Elevate your knee, use ice packs for comfort. Wear the knee immobilizer until your knee pain is gone. Use the crutches until you are able to walk without them. If you are continuing to have knee pain, have an orthopedist recheck your knee. I gave you the name of an orthopedist in [place] and [place], if you want to see one in [place], call your primary care doctor to get a referral.    Cryotherapy Cryotherapy is when you put ice on your injury. Ice helps lessen pain and puffiness (swelling) after an injury. Ice works the best when you start using it in the first 24 to 48 hours after an injury. HOME CARE  Put a dry or damp towel between the ice pack and your skin.  You may press gently on the ice pack.  Leave the ice on for no more than 10 to 20 minutes at a time.  Check your skin after 5 minutes to make sure your skin is okay.  Rest at least 20 minutes between ice pack uses.  Stop using ice when your skin loses feeling (numbness).  Do not use ice on someone who cannot tell you when it hurts. This includes small children and people with memory problems (dementia). GET HELP RIGHT AWAY IF:  You have white spots on your skin.  Your skin turns blue or pale.  Your skin feels waxy or hard.  Your puffiness gets worse. MAKE SURE YOU:   Understand these instructions.  Will watch your condition.  Will get help right away if you are not doing well or get worse.   This information is not intended to replace advice given to you by your health care provider. Make sure you discuss any questions you have with your health care provider.   Document Released: 09/07/2007 Document Revised: 06/13/2011 Document Reviewed: 11/11/2010 Elsevier Interactive Patient Education 2016 Elsevier Inc.  Knee Sprain A knee sprain is a tear in one of the strong, fibrous tissues that connect the bones (ligaments) in your knee. The severity of the sprain depends on how much of the ligament is torn. The  tear can be either partial or complete. CAUSES  Often, sprains are a result of a fall or injury. The force of the impact causes the fibers of your ligament to stretch too much. This excess tension causes the fibers of your ligament to tear. SIGNS AND SYMPTOMS  You may have some loss of motion in your knee. Other symptoms include:  Bruising.  Pain in the knee area.  Tenderness of the knee to the touch.  Swelling. DIAGNOSIS  To diagnose a knee sprain, your health care provider will physically examine your knee. Your health care provider may also suggest an X-ray exam of your knee to make sure no bones are broken. TREATMENT  If your ligament is only partially torn, treatment usually involves keeping the knee in a fixed position (immobilization) or bracing your knee for activities that require movement for several weeks. To do this, your health care provider will apply a bandage, cast, or splint to keep your knee from moving and to support your knee during movement until it heals. For a partially torn ligament, the healing process usually takes 4-6 weeks. If your ligament is completely torn, depending on which ligament it is, you may need surgery to reconnect the ligament to the bone or reconstruct it. After surgery, a cast or splint may be applied and will need to stay on your knee for 4-6 weeks while your  ligament heals. HOME CARE INSTRUCTIONS  Keep your injured knee elevated to decrease swelling.  To ease pain and swelling, apply ice to the injured area:  Put ice in a plastic bag.  Place a towel between your skin and the bag.  Leave the ice on for 20 minutes, 2-3 times a day.  Only take medicine for pain as directed by your health care provider.  Do not leave your knee unprotected until pain and stiffness go away (usually 4-6 weeks).  If you have a cast or splint, do not allow it to get wet. If you have been instructed not to remove it, cover it with a plastic bag when you shower or  bathe. Do not swim.  Your health care provider may suggest exercises for you to do during your recovery to prevent or limit permanent weakness and stiffness. SEEK IMMEDIATE MEDICAL CARE IF:  Your cast or splint becomes damaged.  Your pain becomes worse.  You have significant pain, swelling, or numbness below the cast or splint. MAKE SURE YOU:  Understand these instructions.  Will watch your condition.  Will get help right away if you are not doing well or get worse.   This information is not intended to replace advice given to you by your health care provider. Make sure you discuss any questions you have with your health care provider.   Document Released: 03/21/2005 Document Revised: 04/11/2014 Document Reviewed: 10/31/2012 Elsevier Interactive Patient Education [employer].

## 2015-10-01 NOTE — ED Notes (Signed)
Pt c/o rt knee pain after twisting it earlier.

## 2015-10-01 NOTE — ED Provider Notes (Signed)
CSN: [id number]     Arrival date & time 10/01/15  0002 History  By signing my name below, I, Vista Minkobert Ross, attest that this documentation has been prepared under the direction and in the presence of Devoria AlbeIva Merrillyn Ackerley, MD  At 03:46 AM.  Electronically signed, Vista Minkobert Ross, ED Scribe. 10/01/2015. 4:17 AM.   Chief Complaint  Patient presents with  . Knee Pain   The history is provided by the patient. No language interpreter was used.   HPI Comments: Guillermina Citymber D Medico is a 25 y.o. female who presents to the Emergency Department complaining of sudden onset right knee pain onset 12 hours ago. Pt states she slipped on some water when her knee twisted and she heard a pop. Pt denies falling. Pt reports pain is exacerbating by bearing weight, flexing and extending her knee and standing up and reports no alleviating factors except holding it in mild flexion. No prior knee injury.  Pt states she currently takes Celexa. Pt denies smoking or ETOH use.  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 No pertinent past medical history. Past Surgical History  Procedure Laterality Date  . Fracture surgery     No family history on file. Social History  Substance Use Topics  . Smoking status: Never Smoker   . Smokeless tobacco: None  . Alcohol Use: No   employed  OB History    No data available     Review of Systems  Musculoskeletal: Positive for arthralgias (right knee pain).  Neurological: Negative for numbness.  All other systems reviewed and are negative.  Allergies  Review of patient's allergies indicates no known allergies.  Home Medications   Prior to Admission medications   Medication Sig Start Date End Date Taking? Authorizing Provider  citalopram (CELEXA) 20 MG tablet Take 20 mg by mouth daily.    Historical Provider, MD  naproxen (NAPROSYN) 500 MG tablet Take 1 po BID with food prn pain 10/01/15   Devoria AlbeIva Ronne Savoia, MD   BP 151/90 mmHg  Pulse 87  Temp(Src) 98.2 F  (36.8 C)  Resp 20  Ht 5\' 6"  (1.676 m)  Wt 250 lb (113.399 kg)  BMI 40.37 kg/m2  SpO2 100%  LMP 09/10/2015  Vital signs normal   Physical Exam  Constitutional: She is oriented to person, place, and time. She appears well-developed and well-nourished.  Non-toxic appearance. She does not appear ill. No distress.  HENT:  Head: Normocephalic and atraumatic.  Right Ear: External ear normal.  Left Ear: External ear normal.  Nose: Nose normal. No mucosal edema or rhinorrhea.  Mouth/Throat: Mucous membranes are normal. No dental abscesses or uvula swelling.  Eyes: Conjunctivae and EOM are normal.  Neck: Normal range of motion and full passive range of motion without pain.  Cardiovascular: Normal rate.  Exam reveals no gallop and no friction rub.   No murmur heard. Pulmonary/Chest: Effort normal. No respiratory distress. She has no rhonchi. She exhibits no crepitus.  Abdominal: Normal appearance.  Musculoskeletal: She exhibits tenderness. She exhibits no edema.  Tender medially superior to the medial joint space Pain on lateral rotation of lower leg in same area of her knee. Patella is non tender, no obvious joint fluid.  Neurological: She is alert and oriented to person, place, and time. She has normal strength. No cranial nerve deficit.  Skin: Skin is warm, dry and intact. No rash noted. No erythema. No pallor.  Psychiatric: She has a normal mood and affect. Her speech  is normal and behavior is normal. Her mood appears not anxious.  Nursing note and vitals reviewed.   ED Course  Procedures   Medications  ibuprofen (ADVIL,MOTRIN) tablet 800 mg (not administered)    DIAGNOSTIC STUDIES: Oxygen Saturation is 100% on RA, normal by my interpretation.  COORDINATION OF CARE: 3:51 AM-Discussed her xray and exam.  Discussed treatment plan with pt at bedside and pt agreed to plan.   Patient was placed in a knee immobilizer and crutches. She was advised to elevate her leg and use ice  packs. She was started on nonsteroidal anti-inflammatory drug and is to follow-up with orthopedic if she's not improving. We discussed the x-ray shows the bones however there are several internal structures of the knee that are not apparent on x-ray and if she continues to have pain she would need further evaluation.   Imaging Review Dg Knee Complete 4 Views Right  10/01/2015  CLINICAL DATA:  Slipped in a pothole around 15:30.  Worsening pain. EXAM: RIGHT KNEE - COMPLETE 4+ VIEW COMPARISON:  None. FINDINGS: No evidence of fracture, dislocation, or joint effusion. No evidence of arthropathy or other focal bone abnormality. Soft tissues are unremarkable. IMPRESSION: Negative. Electronically Signed   By: Ellery Plunkaniel R Mitchell M.D.   On: 10/01/2015 00:46   I have personally reviewed and evaluated these images and lab results as part of my medical decision-making.    MDM   Final diagnoses:  Sprain, knee, right, initial encounter    New Prescriptions   NAPROXEN (NAPROSYN) 500 MG TABLET    Take 1 po BID with food prn pain    Plan discharge  Devoria AlbeIva Jaclynn Laumann, MD, FACEP   I personally performed the services described in this documentation, which was scribed in my presence. The recorded information has been reviewed and considered.  Devoria AlbeIva Bristyl Mclees, MD, [facility] PyoFACEP    Ahley Bulls, MD 10/01/15 [phone]

## 2017-03-14 IMAGING — DX DG FOOT COMPLETE 3+V*R*
3 series · 3 of 3 positions shown · non-contrast
Comparison: Foot radiographs 11/06/2013

CLINICAL DATA: Injured foot and ankle today.

EXAM:
RIGHT ANKLE - COMPLETE 3+ VIEW; RIGHT FOOT COMPLETE - 3+ VIEW

[foot ap]
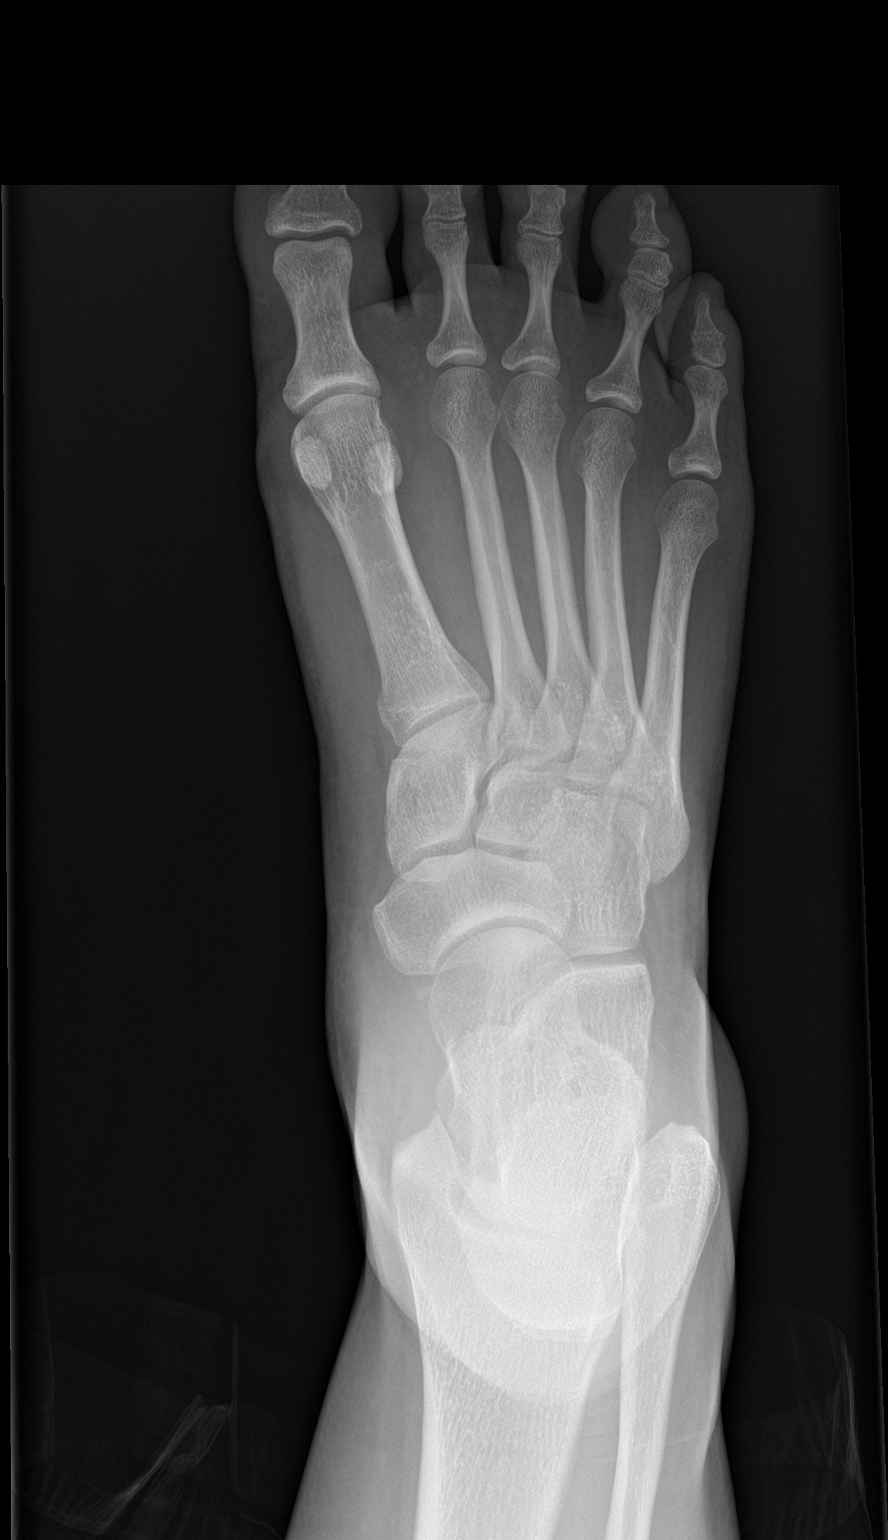

[foot obl]
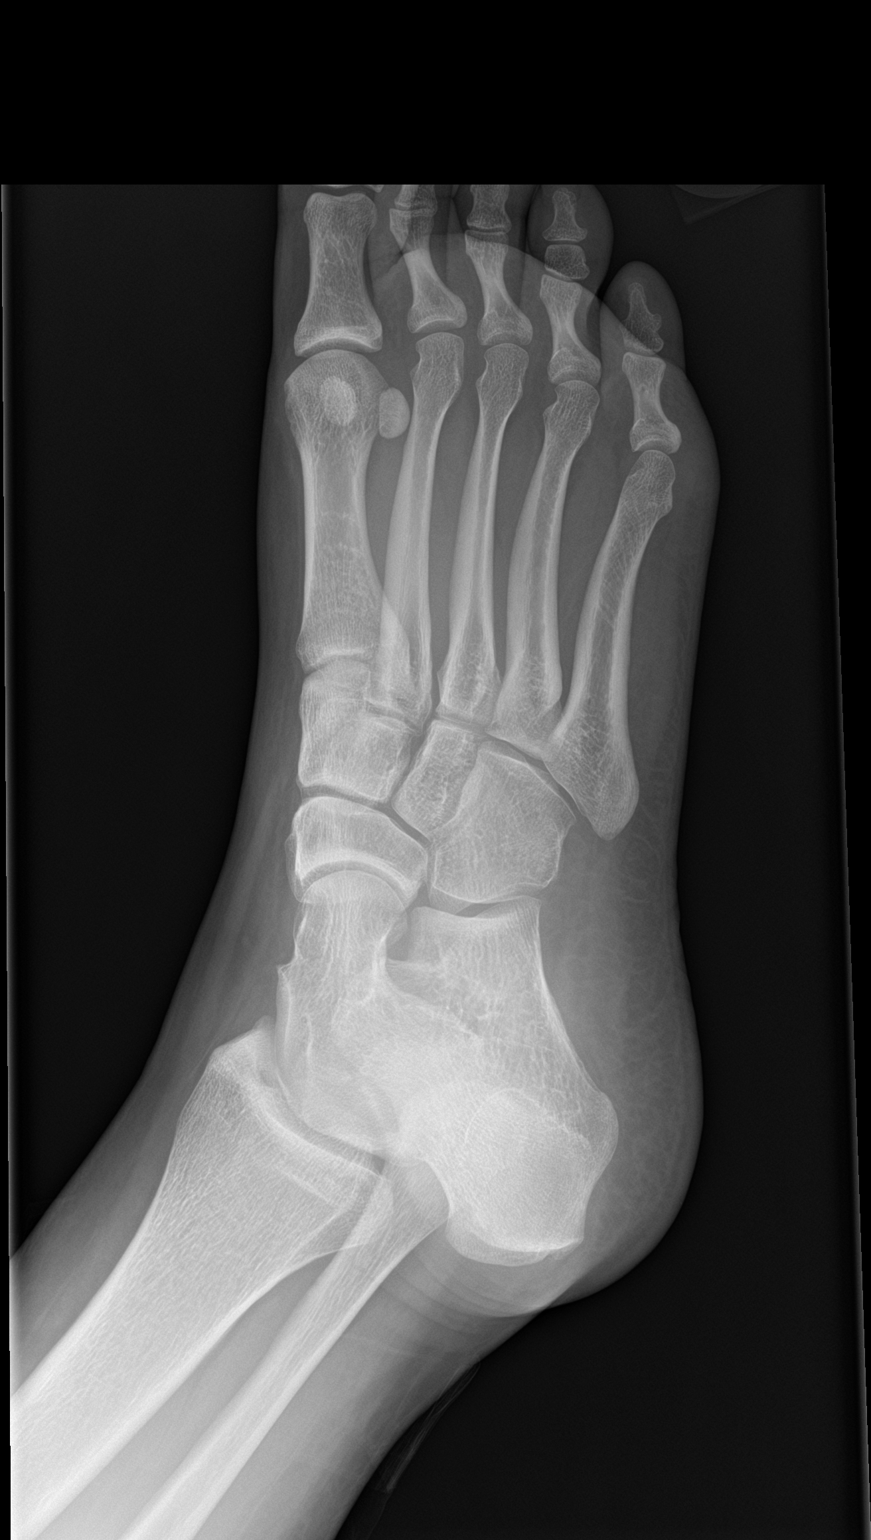

[foot lat]
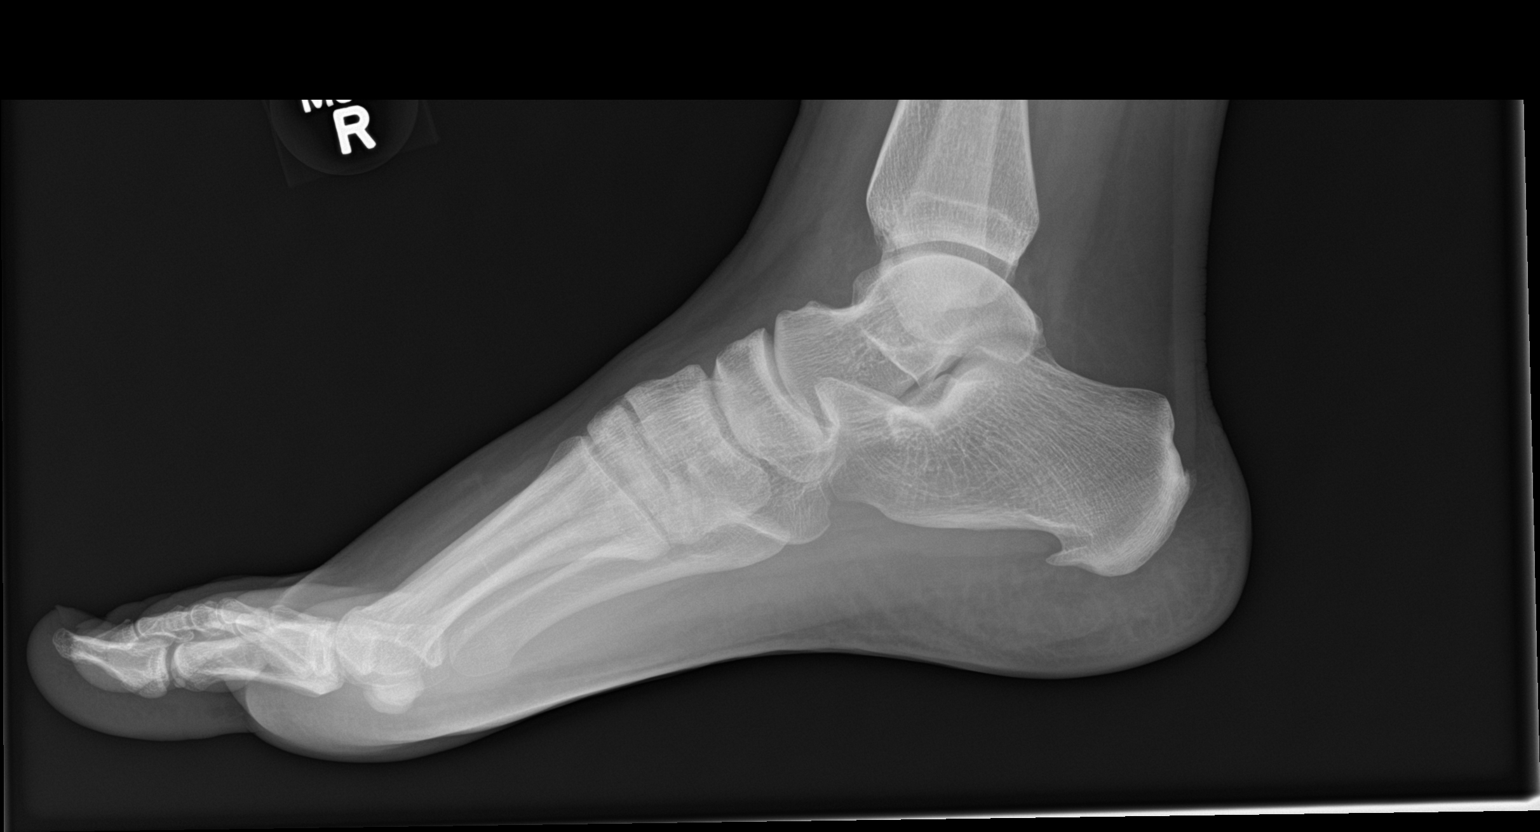

[3 of 3 positions shown; findings below may reference images not displayed]

FINDINGS: Right ankle:

The ankle mortise is maintained. No acute ankle fracture. No
osteochondral lesion. No joint effusion. The mid and hindfoot bony
structures are intact. A calcaneal heel spur is noted.

Right foot:

The joint spaces are maintained.  No acute fracture is identified.
IMPRESSION: No acute bony findings.
